# Patient Record
Sex: Male | Born: 1945 | Race: White | Hispanic: No | Marital: Married | Smoking: Current every day smoker
Health system: Southern US, Community
[De-identification: ages and names within clinical notes are randomized; demographics above are authoritative.]

## PROBLEM LIST (undated history)

## (undated) DIAGNOSIS — J449 Chronic obstructive pulmonary disease, unspecified: Secondary | ICD-10-CM

## (undated) HISTORY — PX: TONSILLECTOMY: SUR1361

---

## 2014-10-31 ENCOUNTER — Encounter (HOSPITAL_BASED_OUTPATIENT_CLINIC_OR_DEPARTMENT_OTHER): Payer: Self-pay | Admitting: *Deleted

## 2014-10-31 ENCOUNTER — Emergency Department (HOSPITAL_BASED_OUTPATIENT_CLINIC_OR_DEPARTMENT_OTHER): Payer: Medicare Other

## 2014-10-31 ENCOUNTER — Emergency Department (HOSPITAL_BASED_OUTPATIENT_CLINIC_OR_DEPARTMENT_OTHER)
Admission: EM | Admit: 2014-10-31 | Discharge: 2014-11-01 | Disposition: A | Discharge status: Home / Self Care | Payer: Medicare Other | Attending: Emergency Medicine | Admitting: Emergency Medicine

## 2014-10-31 DIAGNOSIS — Z720 Tobacco use: Secondary | ICD-10-CM | POA: Insufficient documentation

## 2014-10-31 DIAGNOSIS — R079 Chest pain, unspecified: Secondary | ICD-10-CM | POA: Diagnosis present

## 2014-10-31 DIAGNOSIS — R0789 Other chest pain: Secondary | ICD-10-CM | POA: Diagnosis not present

## 2014-10-31 DIAGNOSIS — Z79899 Other long term (current) drug therapy: Secondary | ICD-10-CM | POA: Diagnosis not present

## 2014-10-31 DIAGNOSIS — J449 Chronic obstructive pulmonary disease, unspecified: Secondary | ICD-10-CM | POA: Insufficient documentation

## 2014-10-31 DIAGNOSIS — K219 Gastro-esophageal reflux disease without esophagitis: Secondary | ICD-10-CM | POA: Insufficient documentation

## 2014-10-31 DIAGNOSIS — IMO0001 Reserved for inherently not codable concepts without codable children: Secondary | ICD-10-CM

## 2014-10-31 HISTORY — DX: Chronic obstructive pulmonary disease, unspecified: J44.9

## 2014-10-31 LAB — CBC WITH DIFFERENTIAL/PLATELET
BASOS PCT: 0 % (ref 0–1)
Basophils Absolute: 0 10*3/uL (ref 0.0–0.1)
EOS ABS: 0.1 10*3/uL (ref 0.0–0.7)
EOS PCT: 1 % (ref 0–5)
HEMATOCRIT: 39.8 % (ref 39.0–52.0)
HEMOGLOBIN: 12.8 g/dL — AB (ref 13.0–17.0)
Lymphocytes Relative: 43 % (ref 12–46)
Lymphs Abs: 3.3 10*3/uL (ref 0.7–4.0)
MCH: 31.2 pg (ref 26.0–34.0)
MCHC: 32.2 g/dL (ref 30.0–36.0)
MCV: 97.1 fL (ref 78.0–100.0)
MONOS PCT: 7 % (ref 3–12)
Monocytes Absolute: 0.5 10*3/uL (ref 0.1–1.0)
NEUTROS ABS: 3.8 10*3/uL (ref 1.7–7.7)
Neutrophils Relative %: 49 % (ref 43–77)
PLATELETS: 167 10*3/uL (ref 150–400)
RBC: 4.1 MIL/uL — AB (ref 4.22–5.81)
RDW: 13.5 % (ref 11.5–15.5)
WBC: 7.7 10*3/uL (ref 4.0–10.5)

## 2014-10-31 LAB — COMPREHENSIVE METABOLIC PANEL
ALBUMIN: 3.9 g/dL (ref 3.5–5.2)
ALT: 16 U/L (ref 0–53)
ANION GAP: 8 (ref 5–15)
AST: 16 U/L (ref 0–37)
Alkaline Phosphatase: 68 U/L (ref 39–117)
BILIRUBIN TOTAL: 0.2 mg/dL — AB (ref 0.3–1.2)
BUN: 21 mg/dL (ref 6–23)
CHLORIDE: 102 mmol/L (ref 96–112)
CO2: 25 mmol/L (ref 19–32)
Calcium: 10.2 mg/dL (ref 8.4–10.5)
Creatinine, Ser: 0.97 mg/dL (ref 0.50–1.35)
GFR calc non Af Amer: 83 mL/min — ABNORMAL LOW (ref >90)
Glucose, Bld: 104 mg/dL — ABNORMAL HIGH (ref 70–99)
Potassium: 4.1 mmol/L (ref 3.5–5.1)
Sodium: 135 mmol/L (ref 135–145)
Total Protein: 6.9 g/dL (ref 6.0–8.3)

## 2014-10-31 LAB — TROPONIN I: Troponin I: <0.03 ng/mL (ref <0.031)

## 2014-10-31 MED ORDER — METOCLOPRAMIDE HCL 5 MG/ML IJ SOLN
10.0000 mg | Freq: Once | INTRAMUSCULAR | Status: AC
  Administered 2014-10-31: 10 mg via INTRAVENOUS
  Filled 2014-10-31: qty 2

## 2014-10-31 MED ORDER — ESOMEPRAZOLE MAGNESIUM 40 MG PO CPDR: 40.0000 mg | DELAYED_RELEASE_CAPSULE | Freq: Every day | ORAL | Status: AC

## 2014-10-31 MED ORDER — FAMOTIDINE IN NACL 20-0.9 MG/50ML-% IV SOLN
20.0000 mg | Freq: Once | INTRAVENOUS | Status: AC
  Administered 2014-10-31: 20 mg via INTRAVENOUS
  Filled 2014-10-31: qty 50

## 2014-10-31 MED ORDER — DIPHENHYDRAMINE HCL 50 MG/ML IJ SOLN
25.0000 mg | Freq: Once | INTRAMUSCULAR | Status: AC
  Administered 2014-10-31: 25 mg via INTRAVENOUS
  Filled 2014-10-31: qty 1

## 2014-10-31 MED ORDER — GI COCKTAIL ~~LOC~~
30.0000 mL | Freq: Once | ORAL | Status: AC
  Administered 2014-10-31: 30 mL via ORAL
  Filled 2014-10-31: qty 30

## 2014-10-31 MED ORDER — FAMOTIDINE 20 MG PO TABS: 20.0000 mg | ORAL_TABLET | Freq: Two times a day (BID) | ORAL | Status: AC | PRN

## 2014-10-31 NOTE — ED Notes (Signed)
Reports chest pain (mild) this morning before church- ate coney dogs today for lunch- has had several episodes of chest pain throughout day- now unable to get relief- took zantac

## 2014-10-31 NOTE — Discharge Instructions (Signed)
Take nexium daily.   Take pepcid and maalox as needed.   Follow up with your doctor.   Return to ER if you have worse chest pain, shortness of breath.

## 2014-10-31 NOTE — ED Provider Notes (Addendum)
CSN: [id number]     Arrival date & time 10/31/14  2102 History  This chart was scribed for Vincent Canalavid H Ira Busbin, MD by Vincent RoyaltyJoshua Franco, ED Scribe. This patient was seen in room MH11/MH11 and the patient's care was started at 9:27 PM.    Chief Complaint  Patient presents with  . Chest Pain   The history is provided by the patient. No language interpreter was used.    HPI Comments: Vincent Franco is a 69 y.o. male who presents to the Emergency Department complaining of upper, central chest pain with onset this morning. He states pain initially felt like acid reflux and he had burping; he states he had spicy food for lunch. Symptoms improved, then chest pain recurred at 1700, more severe than before. He describes pain as pressure, not burning or sharp. He states pain is improved at this time, rated at 3-4/10. He denies vomiting except self-induced vomiting, which seemed to improve symptoms. He sued Zantac without improvement. He has history of COPD and he smokes. He denies cardiac history and denies FHx of cardiac problems.   Past Medical History  Diagnosis Date  . COPD (chronic obstructive pulmonary disease)    Past Surgical History  Procedure Laterality Date  . Tonsillectomy     No family history on file. History  Substance Use Topics  . Smoking status: Current Every Day Smoker  . Smokeless tobacco: Never Used  . Alcohol Use: No    Review of Systems  Cardiovascular: Positive for chest pain.  Gastrointestinal: Positive for vomiting (self-induced). Negative for nausea.  All other systems reviewed and are negative.     Allergies  Review of patient's allergies indicates no known allergies.  Home Medications   Prior to Admission medications   Medication Sig Start Date End Date Taking? Authorizing Provider  esomeprazole (NEXIUM) 40 MG capsule Take 1 capsule (40 mg total) by mouth daily. 10/31/14   Vincent Canalavid H Delray Reza, MD  famotidine (PEPCID) 20 MG tablet Take 1 tablet (20 mg total) by mouth 2 (two) times  daily as needed for heartburn or indigestion. 10/31/14   Vincent Canalavid H Sundeep Destin, MD   BP 119/56 mmHg  Pulse 62  Temp(Src) 98.6 F (37 C) (Oral)  Resp 16  Ht 6\' 4"  (1.93 m)  Wt 250 lb (113.399 kg)  BMI 30.44 kg/m2  SpO2 94% Physical Exam  Constitutional: He is oriented to person, place, and time. He appears well-developed and well-nourished.  HENT:  Head: Normocephalic and atraumatic.  Eyes: Conjunctivae are normal.  Neck: Normal range of motion. Neck supple.  Cardiovascular: Normal rate and normal heart sounds.   No murmur heard. Pulmonary/Chest: Effort normal and breath sounds normal. No respiratory distress. He has no wheezes. He has no rales.  Chest pain not reprodicable  Abdominal: Soft. There is no tenderness.  Musculoskeletal: Normal range of motion.  Neurological: He is alert and oriented to person, place, and time.  Skin: Skin is warm and dry.  Psychiatric: He has a normal mood and affect.  Nursing note and vitals reviewed.   ED Course  Procedures (including critical care time)  DIAGNOSTIC STUDIES: Oxygen Saturation is 99% on room air, normal by my interpretation.    COORDINATION OF CARE: 9:32 PM Discussed treatment plan with patient at beside, including cardiac workup and GI cocktail. The patient agrees with the plan and has no further questions at this time.   Labs Review Labs Reviewed  CBC WITH DIFFERENTIAL/PLATELET - Abnormal; Notable for the following:    RBC  4.10 (*)    Hemoglobin 12.8 (*)    All other components within normal limits  COMPREHENSIVE METABOLIC PANEL - Abnormal; Notable for the following:    Glucose, Bld 104 (*)    Total Bilirubin 0.2 (*)    GFR calc non Af Amer 83 (*)    All other components within normal limits  TROPONIN I  TROPONIN I    Imaging Review Dg Chest 2 View  10/31/2014   CLINICAL DATA:  Chest pain  EXAM: CHEST  2 VIEW  COMPARISON:  None.  FINDINGS: Normal heart size and mediastinal contours. No acute infiltrate or edema. No effusion  or pneumothorax.  Compression fracture at the thoracolumbar junction, likely L1, age indeterminate. Height loss is approximately 75% anteriorly.  IMPRESSION: 1. No active cardiopulmonary disease. 2. L1 compression fracture.   Electronically Signed   By: Marnee Spring M.D.   On: 10/31/2014 23:25     EKG Interpretation   Date/Time:  Sunday October 31 2014 21:08:13 EDT Ventricular Rate:  66 PR Interval:  178 QRS Duration: 100 QT Interval:  382 QTC Calculation: 400 R Axis:   58 Text Interpretation:  Normal sinus rhythm Normal ECG No previous ECGs  available Confirmed by Vincent Rosal  MD, Vincent Franco ([id number]) on 10/31/2014 9:26:56 PM      MDM   Final diagnoses:  Other chest pain  Reflux   Vincent Franco is a 69 y.o. male here with chest pain after spicy food. Consider ACS vs reflux. Will get delta trop. I doubt PE or dissection.  11:45 pm Trop neg x 1. CXR clear. Pain free. Signed out to Dr. Nicanor Franco. If delta trop neg and pain free can d/c home.    I personally performed the services described in this documentation, which was scribed in my presence. The recorded information has been reviewed and is accurate.   Vincent Canal, MD 10/31/14 [id number]  Vincent Canal, MD 10/31/14 [phone]

## 2014-10-31 NOTE — ED Notes (Signed)
MD at bedside. 

## 2014-11-01 DIAGNOSIS — R0789 Other chest pain: Secondary | ICD-10-CM | POA: Diagnosis not present

## 2014-11-01 LAB — TROPONIN I

## 2016-01-08 IMAGING — DX DG CHEST 2V
1 series · 3 of 3 positions shown · non-contrast
Comparison: None.

CLINICAL DATA: Chest pain

EXAM:
CHEST  2 VIEW

[Series 2: chest lat · 0.14mm/px · 3 of 3 slices shown]
[im 1/3]
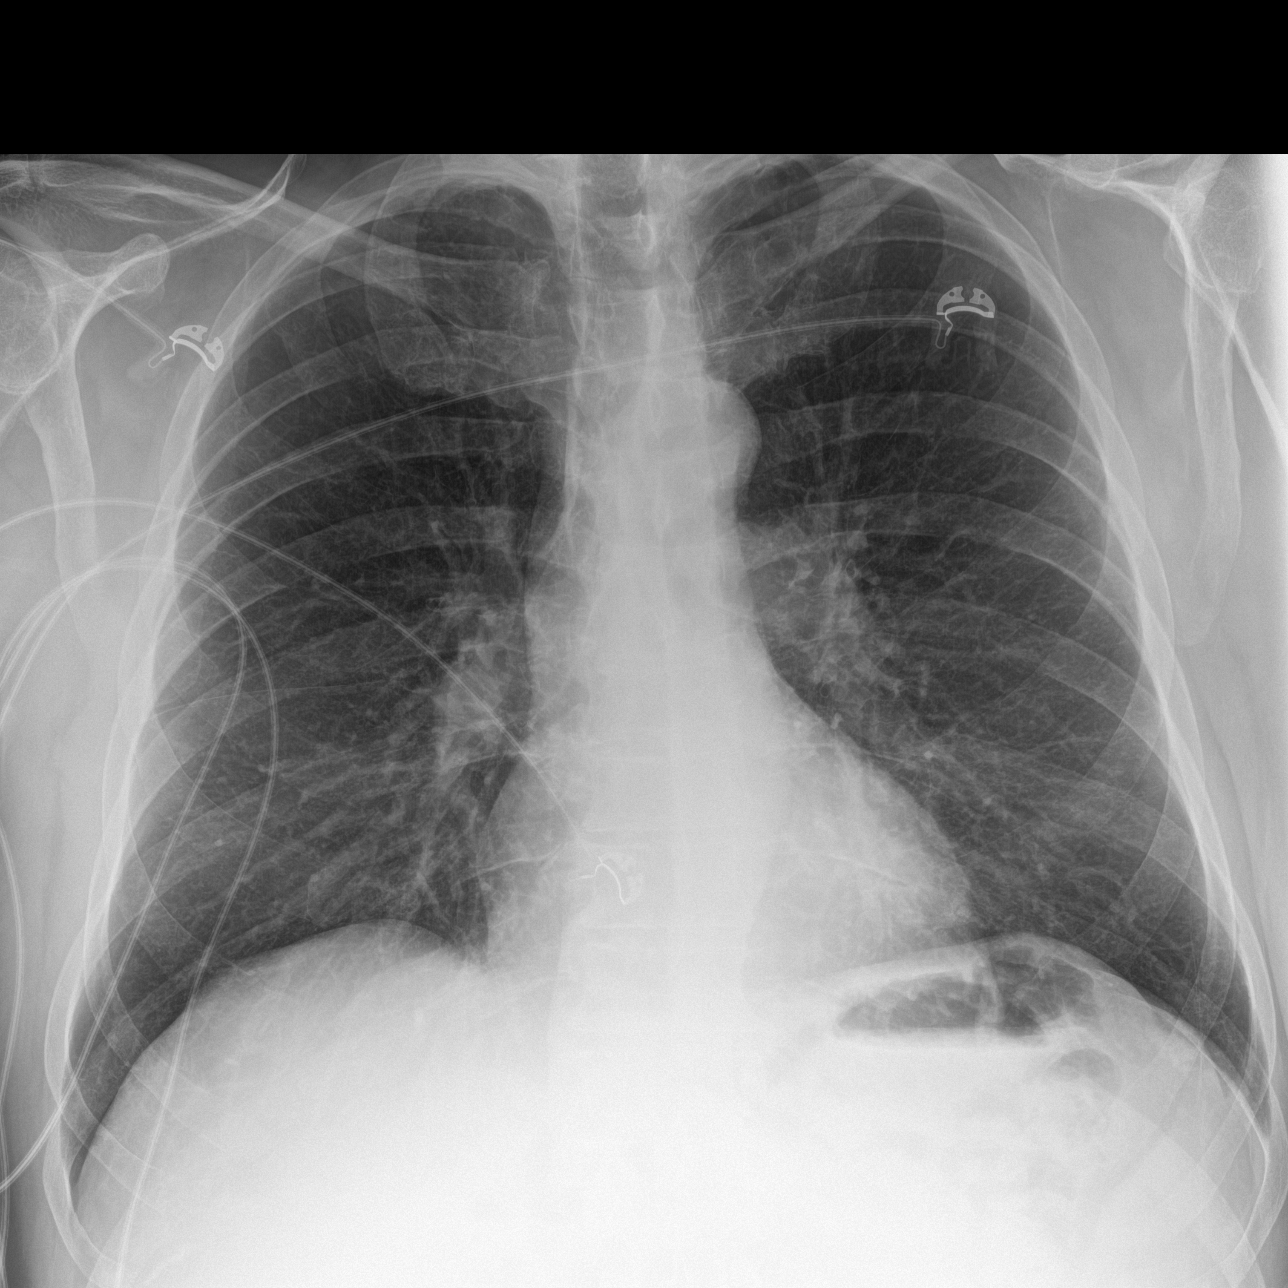
[im 2/3]
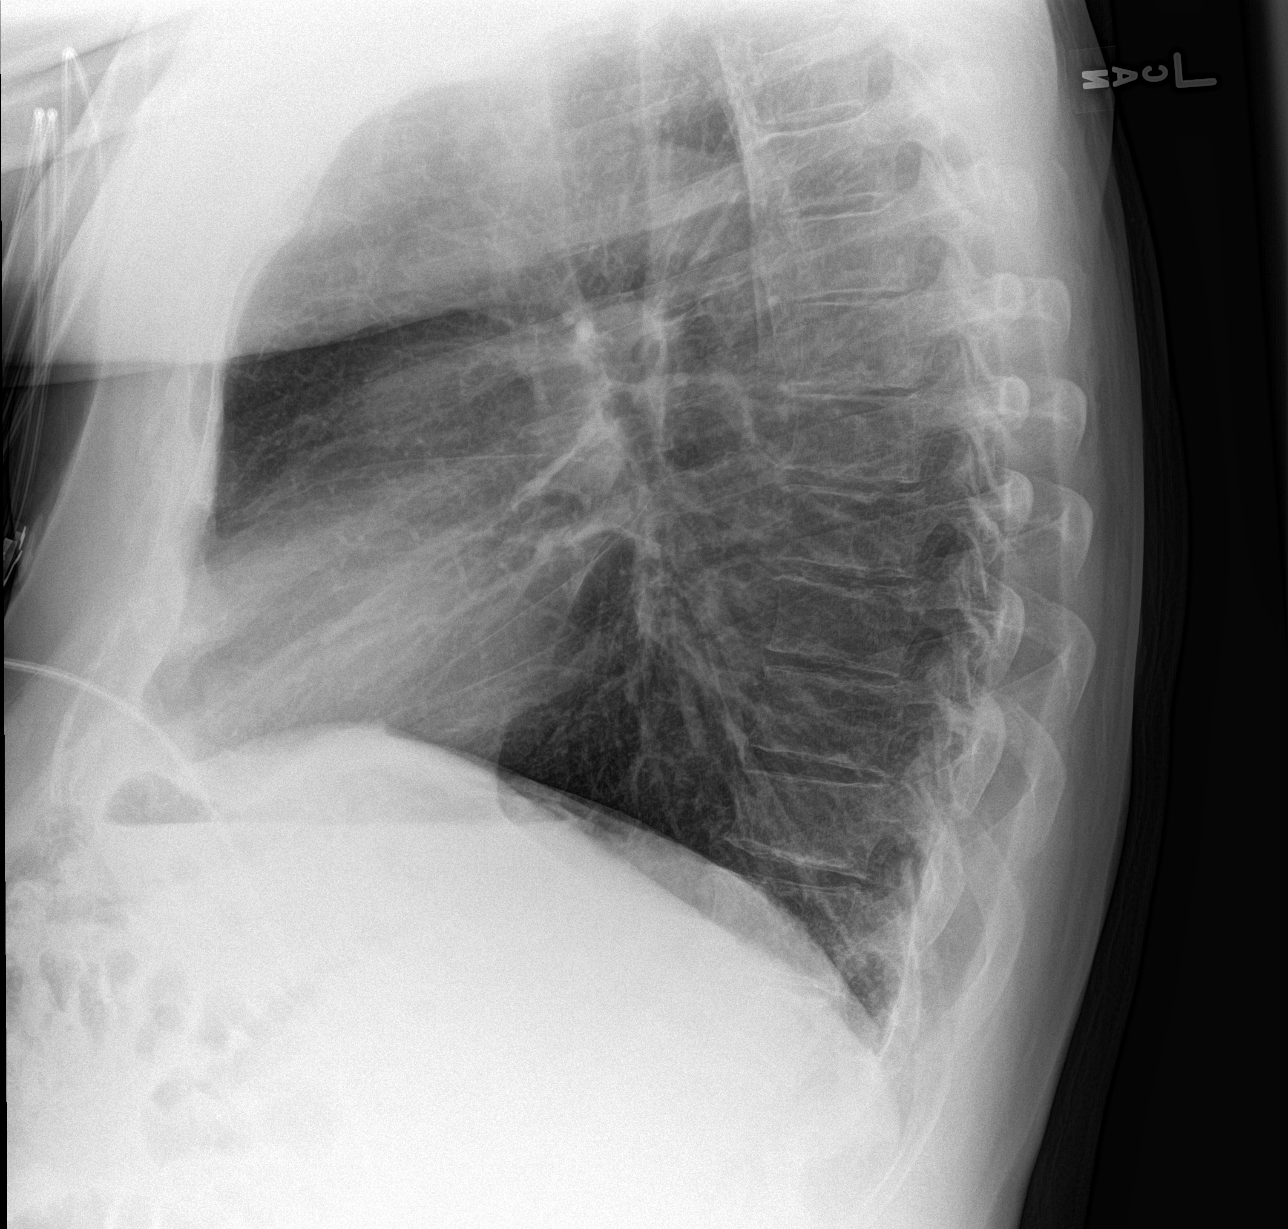
[im 3/3]
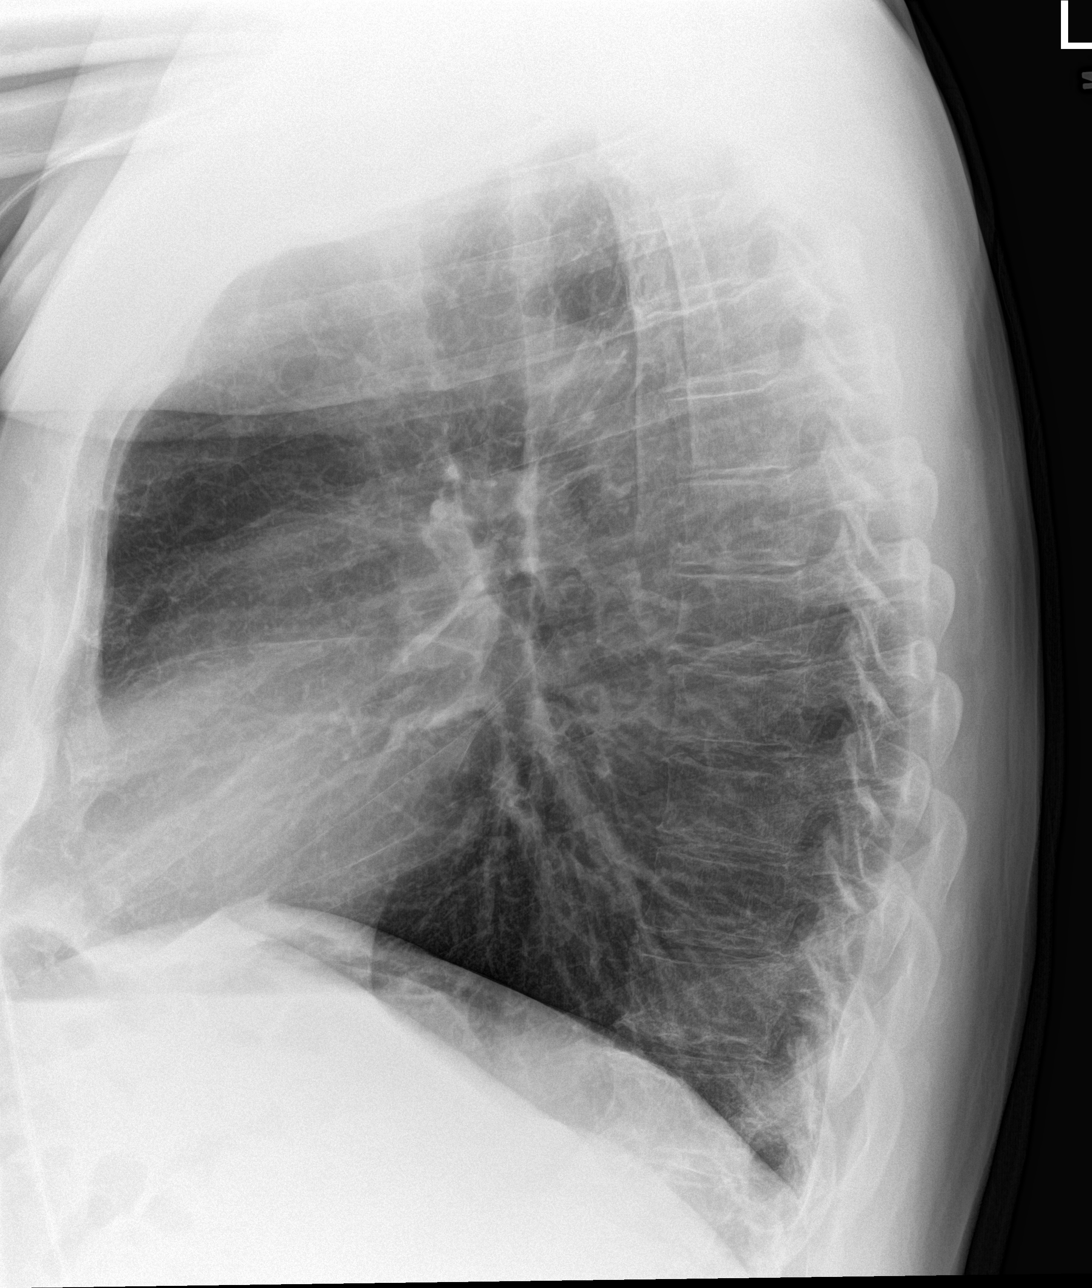

[3 of 3 positions shown; findings below may reference images not displayed]

FINDINGS: Normal heart size and mediastinal contours. No acute infiltrate or
edema. No effusion or pneumothorax.

Compression fracture at the thoracolumbar junction, likely L1, age
indeterminate. Height loss is approximately 75% anteriorly.
IMPRESSION: 1. No active cardiopulmonary disease.
2. L1 compression fracture.
